# Patient Record
Sex: Female | Born: 1960 | Race: White | Hispanic: No | Marital: Married | Smoking: Never smoker
Health system: Southern US, Academic
[De-identification: ages and names within clinical notes are randomized; demographics above are authoritative.]

## PROBLEM LIST (undated history)

## (undated) DIAGNOSIS — K219 Gastro-esophageal reflux disease without esophagitis: Secondary | ICD-10-CM

## (undated) DIAGNOSIS — G4730 Sleep apnea, unspecified: Secondary | ICD-10-CM

## (undated) DIAGNOSIS — E039 Hypothyroidism, unspecified: Secondary | ICD-10-CM

## (undated) DIAGNOSIS — E109 Type 1 diabetes mellitus without complications: Secondary | ICD-10-CM

## (undated) DIAGNOSIS — E782 Mixed hyperlipidemia: Secondary | ICD-10-CM

## (undated) DIAGNOSIS — F419 Anxiety disorder, unspecified: Secondary | ICD-10-CM

## (undated) DIAGNOSIS — I10 Essential (primary) hypertension: Secondary | ICD-10-CM

## (undated) HISTORY — DX: Essential (primary) hypertension: I10

## (undated) HISTORY — DX: Hypothyroidism, unspecified: E03.9

## (undated) HISTORY — DX: Gastro-esophageal reflux disease without esophagitis: K21.9

## (undated) HISTORY — PX: HX GALL BLADDER SURGERY/CHOLE: SHX55

## (undated) HISTORY — PX: HX HYSTERECTOMY: SHX81

## (undated) HISTORY — DX: Mixed hyperlipidemia: E78.2

## (undated) HISTORY — DX: Sleep apnea, unspecified: G47.30

## (undated) HISTORY — PX: BLADDER SURGERY: SHX569

## (undated) HISTORY — DX: Type 1 diabetes mellitus without complications (CMS HCC): E10.9

## (undated) HISTORY — DX: Anxiety disorder, unspecified: F41.9

---

## 2009-11-15 ENCOUNTER — Observation Stay (HOSPITAL_COMMUNITY): Payer: Self-pay | Admitting: Obstetrics & Gynecology

## 2022-04-07 ENCOUNTER — Encounter (INDEPENDENT_AMBULATORY_CARE_PROVIDER_SITE_OTHER): Payer: Self-pay | Admitting: Surgery

## 2022-04-13 ENCOUNTER — Other Ambulatory Visit: Payer: Self-pay

## 2022-04-13 ENCOUNTER — Ambulatory Visit (INDEPENDENT_AMBULATORY_CARE_PROVIDER_SITE_OTHER): Payer: Medicaid Other | Admitting: Surgery

## 2022-04-13 ENCOUNTER — Encounter (INDEPENDENT_AMBULATORY_CARE_PROVIDER_SITE_OTHER): Payer: Self-pay | Admitting: Surgery

## 2022-04-13 VITALS — BP 139/75 | HR 84 | Temp 97.4°F | Ht 63.0 in | Wt 206.4 lb

## 2022-04-13 DIAGNOSIS — K219 Gastro-esophageal reflux disease without esophagitis: Secondary | ICD-10-CM

## 2022-04-13 DIAGNOSIS — Z6836 Body mass index (BMI) 36.0-36.9, adult: Secondary | ICD-10-CM

## 2022-04-13 DIAGNOSIS — R935 Abnormal findings on diagnostic imaging of other abdominal regions, including retroperitoneum: Secondary | ICD-10-CM

## 2022-04-13 DIAGNOSIS — R1013 Epigastric pain: Secondary | ICD-10-CM

## 2022-04-13 MED ORDER — PEG 3350-ELECTROLYTES 236 GRAM-22.74 GRAM-6.74 GRAM-5.86 GRAM SOLUTION
4.0000 L | Freq: Once | ORAL | 0 refills | Status: DC
Start: 2022-04-13 — End: 2022-05-09

## 2022-04-15 ENCOUNTER — Encounter (INDEPENDENT_AMBULATORY_CARE_PROVIDER_SITE_OTHER): Payer: Self-pay | Admitting: Surgery

## 2022-04-15 NOTE — H&P (Signed)
Office History and Physical      Reason for Visit: General (Abnormal MRI; mural thickening of sigmoid colon )    History of Present Illness  Rebecca Cain as a referral by Laurence Aly, CNP for evaluation of endoscopy with GERD and abnormal MRI which revealed posterior wall thickening , recommended endoscopy.  Denies constipation, diarrhea, change in bowel habits, rectal bleeding, weight loss, blood in stools, change in stool caliber, mucous in stools, or unintentional weight loss.  No abdominal pain.  No family history of colon cancer.  No melena, hematemesis, or dysphagia. Last colonoscopy ws at age of 89. Last A1c was 6.3    I have reviewed the patient's provided medical records and diagnostic testing including laboratory values, imaging results, documented encounters and providers notes with all pertinent information noted with respect to today's evaluation serving as unique tests and sources as a component of the medical decision making process for this encounter relevant to the patients independent evaluation by me today.        Patient Data  Patient History  Past Medical History:   Diagnosis Date   . Anxiety    . Diabetes mellitus type 1 (CMS HCC)    . Esophageal reflux    . Hypertension    . Hypothyroidism    . Mixed hyperlipidemia    . Sleep apnea          Past Surgical History:   Procedure Laterality Date   . BLADDER SURGERY     . HX CHOLECYSTECTOMY     . HX HYSTERECTOMY           Current Outpatient Medications   Medication Sig   . amLODIPine (NORVASC) 5 mg Oral Tablet amlodipine 5 mg tablet   . cyclobenzaprine (FLEXERIL) 10 mg Oral Tablet cyclobenzaprine 10 mg tablet   . escitalopram oxalate (LEXAPRO) 10 mg Oral Tablet escitalopram 10 mg tablet   . levothyroxine (SYNTHROID) 75 mcg Oral Tablet levothyroxine 75 mcg tablet   . metoclopramide HCl (REGLAN) 5 mg Oral Tablet metoclopramide 5 mg tablet   . oxybutynin (DITROPAN) 5 mg Oral Tablet oxybutynin chloride 5 mg tablet   . pantoprazole  (PROTONIX) 20 mg Oral Tablet, Delayed Release (E.C.) pantoprazole 20 mg tablet,delayed release   . rosuvastatin (CRESTOR) 5 mg Oral Tablet rosuvastatin 5 mg tablet   . spironolactone (ALDACTONE) 25 mg Oral Tablet spironolactone 25 mg tablet     No Known Allergies  Family Medical History:    None         Social History     Tobacco Use   . Smoking status: Never   . Smokeless tobacco: Never   Vaping Use   . Vaping Use: Never used   Substance Use Topics   . Alcohol use: Not Currently   . Drug use: Not Currently        The above documented section regarding past medical, past surgical, family, and social history (PMFSH) has been reviewed and considered and to the best of my knowledge represents a valid and accurate reflection of the patient's previous pertinent experiences documented by multiple providers and participants of the EMR.I cannot attest to all entries but do no recognize any gross inaccuracies as the data is a common field across all providers  Further history pertinent to the current encounter will be found as referenced       Physical Examination:    Vitals:    04/13/22 1349   BP: 139/75   Pulse: 84  Temp: 36.3 C (97.4 F)   SpO2: 94%   Weight: 93.6 kg (206 lb 6.4 oz)   Height: 1.6 m (5\' 3" )   BMI: 36.64      General: appropriate for age. in no acute distress.    HEENT: Atraumatic, Normocephalic.    Lungs: Nonlabored breathing with symmetric expansion    Heart:Regular wth respect to rate and rythmn.    Abdomen:Soft. Nontender. Nondistended     Psychiatric: Alert and oriented to person, place, and time. affect appropriate    Skin: No rashes or obvious skin lesions         Diagnosis:    ICD-10-CM    1. Gastroesophageal reflux disease, unspecified whether esophagitis present  K21.9       2. Abnormal MRI, pelvis  R93.5       3. Epigastric pain  R10.13           Plan:    Discussed indications, risks and benefits of colonoscopy and upper endoscopy with the patient.  Discussed the possibility of polypectomy,  biopsies, and possible repeat examinations.  Risks include bleeding, sedation risks, possibility of missed diagnosis of polyp or malignancy, and remote possibilities of perforation and death.  All questions were answered and informed consent was clearly obtained      This note may have been partially generated using MModal Fluency Direct system, and there may be some incorrect words, spellings, and punctuation that were not noted in checking the note before saving, though effort was made to avoid such errors.    MD FACS RVT  [facility] -General Surgery

## 2022-04-15 NOTE — H&P (View-Only) (Signed)
Office History and Physical      Reason for Visit: General (Abnormal MRI; mural thickening of sigmoid colon )    History of Present Illness  Rebecca Cain presents as a referral by Laurence Aly, CNP for evaluation of endoscopy with GERD and abnormal MRI which revealed posterior wall thickening , recommended endoscopy.  Denies constipation, diarrhea, change in bowel habits, rectal bleeding, weight loss, blood in stools, change in stool caliber, mucous in stools, or unintentional weight loss.  No abdominal pain.  No family history of colon cancer.  No melena, hematemesis, or dysphagia. Last colonoscopy ws at age of 89. Last A1c was 6.3    I have reviewed the patient's provided medical records and diagnostic testing including laboratory values, imaging results, documented encounters and providers notes with all pertinent information noted with respect to today's evaluation serving as unique tests and sources as a component of the medical decision making process for this encounter relevant to the patients independent evaluation by me today.        Patient Data  Patient History  Past Medical History:   Diagnosis Date   . Anxiety    . Diabetes mellitus type 1 (CMS HCC)    . Esophageal reflux    . Hypertension    . Hypothyroidism    . Mixed hyperlipidemia    . Sleep apnea          Past Surgical History:   Procedure Laterality Date   . BLADDER SURGERY     . HX CHOLECYSTECTOMY     . HX HYSTERECTOMY           Current Outpatient Medications   Medication Sig   . amLODIPine (NORVASC) 5 mg Oral Tablet amlodipine 5 mg tablet   . cyclobenzaprine (FLEXERIL) 10 mg Oral Tablet cyclobenzaprine 10 mg tablet   . escitalopram oxalate (LEXAPRO) 10 mg Oral Tablet escitalopram 10 mg tablet   . levothyroxine (SYNTHROID) 75 mcg Oral Tablet levothyroxine 75 mcg tablet   . metoclopramide HCl (REGLAN) 5 mg Oral Tablet metoclopramide 5 mg tablet   . oxybutynin (DITROPAN) 5 mg Oral Tablet oxybutynin chloride 5 mg tablet   . pantoprazole  (PROTONIX) 20 mg Oral Tablet, Delayed Release (E.C.) pantoprazole 20 mg tablet,delayed release   . rosuvastatin (CRESTOR) 5 mg Oral Tablet rosuvastatin 5 mg tablet   . spironolactone (ALDACTONE) 25 mg Oral Tablet spironolactone 25 mg tablet     No Known Allergies  Family Medical History:    None         Social History     Tobacco Use   . Smoking status: Never   . Smokeless tobacco: Never   Vaping Use   . Vaping Use: Never used   Substance Use Topics   . Alcohol use: Not Currently   . Drug use: Not Currently        The above documented section regarding past medical, past surgical, family, and social history (PMFSH) has been reviewed and considered and to the best of my knowledge represents a valid and accurate reflection of the patient's previous pertinent experiences documented by multiple providers and participants of the EMR.I cannot attest to all entries but do no recognize any gross inaccuracies as the data is a common field across all providers  Further history pertinent to the current encounter will be found as referenced       Physical Examination:    Vitals:    04/13/22 1349   BP: 139/75   Pulse: 84  Temp: 36.3 C (97.4 F)   SpO2: 94%   Weight: 93.6 kg (206 lb 6.4 oz)   Height: 1.6 m (5\' 3" )   BMI: 36.64      General: appropriate for age. in no acute distress.    HEENT: Atraumatic, Normocephalic.    Lungs: Nonlabored breathing with symmetric expansion    Heart:Regular wth respect to rate and rythmn.    Abdomen:Soft. Nontender. Nondistended     Psychiatric: Alert and oriented to person, place, and time. affect appropriate    Skin: No rashes or obvious skin lesions         Diagnosis:    ICD-10-CM    1. Gastroesophageal reflux disease, unspecified whether esophagitis present  K21.9       2. Abnormal MRI, pelvis  R93.5       3. Epigastric pain  R10.13           Plan:    Discussed indications, risks and benefits of colonoscopy and upper endoscopy with the patient.  Discussed the possibility of polypectomy,  biopsies, and possible repeat examinations.  Risks include bleeding, sedation risks, possibility of missed diagnosis of polyp or malignancy, and remote possibilities of perforation and death.  All questions were answered and informed consent was clearly obtained      This note may have been partially generated using MModal Fluency Direct system, and there may be some incorrect words, spellings, and punctuation that were not noted in checking the note before saving, though effort was made to avoid such errors.    MD FACS RVT  [facility] -General Surgery

## 2022-04-21 ENCOUNTER — Encounter (INDEPENDENT_AMBULATORY_CARE_PROVIDER_SITE_OTHER): Payer: Self-pay | Admitting: Surgery

## 2022-05-09 ENCOUNTER — Encounter (HOSPITAL_COMMUNITY)
Admission: RE | Disposition: A | Discharge status: Home / Self Care | Payer: Self-pay | Source: Ambulatory Visit | Attending: Surgery

## 2022-05-09 ENCOUNTER — Ambulatory Visit
Admission: RE | Admit: 2022-05-09 | Discharge: 2022-05-09 | Disposition: A | Discharge status: Home / Self Care | Payer: Medicaid Other | Source: Ambulatory Visit | Attending: Surgery | Admitting: Surgery

## 2022-05-09 ENCOUNTER — Encounter (HOSPITAL_COMMUNITY): Payer: Self-pay | Admitting: Surgery

## 2022-05-09 ENCOUNTER — Ambulatory Visit (HOSPITAL_COMMUNITY): Payer: Medicaid Other | Admitting: Certified Registered"

## 2022-05-09 ENCOUNTER — Ambulatory Visit (HOSPITAL_COMMUNITY): Payer: Medicaid Other | Admitting: Surgery

## 2022-05-09 ENCOUNTER — Other Ambulatory Visit: Payer: Self-pay

## 2022-05-09 DIAGNOSIS — K31A0 Gastric intestinal metaplasia, unspecified: Secondary | ICD-10-CM | POA: Insufficient documentation

## 2022-05-09 DIAGNOSIS — R1013 Epigastric pain: Secondary | ICD-10-CM | POA: Insufficient documentation

## 2022-05-09 DIAGNOSIS — K5730 Diverticulosis of large intestine without perforation or abscess without bleeding: Secondary | ICD-10-CM | POA: Insufficient documentation

## 2022-05-09 DIAGNOSIS — R935 Abnormal findings on diagnostic imaging of other abdominal regions, including retroperitoneum: Secondary | ICD-10-CM | POA: Insufficient documentation

## 2022-05-09 DIAGNOSIS — K2970 Gastritis, unspecified, without bleeding: Secondary | ICD-10-CM

## 2022-05-09 DIAGNOSIS — K2950 Unspecified chronic gastritis without bleeding: Secondary | ICD-10-CM | POA: Insufficient documentation

## 2022-05-09 DIAGNOSIS — K648 Other hemorrhoids: Secondary | ICD-10-CM | POA: Insufficient documentation

## 2022-05-09 DIAGNOSIS — K219 Gastro-esophageal reflux disease without esophagitis: Secondary | ICD-10-CM | POA: Insufficient documentation

## 2022-05-09 DIAGNOSIS — K319 Disease of stomach and duodenum, unspecified: Secondary | ICD-10-CM | POA: Insufficient documentation

## 2022-05-09 SURGERY — GASTROSCOPY WITH BIOPSY
Anesthesia: General | Wound class: Clean Contaminated Wounds-The respiratory, GI, Genital, or urinary

## 2022-05-09 MED ORDER — LIDOCAINE (PF) 100 MG/5 ML (2 %) INTRAVENOUS SYRINGE
INJECTION | Freq: Once | INTRAVENOUS | Status: DC | PRN
Start: 2022-05-09 — End: 2022-05-09
  Administered 2022-05-09: 100 mg via INTRAVENOUS

## 2022-05-09 MED ORDER — PROPOFOL 10 MG/ML INTRAVENOUS EMULSION
Freq: Once | INTRAVENOUS | Status: DC | PRN
Start: 2022-05-09 — End: 2022-05-09
  Administered 2022-05-09: 25 mL via INTRAVENOUS

## 2022-05-09 MED ORDER — LACTATED RINGERS INTRAVENOUS SOLUTION
INTRAVENOUS | Status: DC
Start: 2022-05-09 — End: 2022-05-09
  Administered 2022-05-09: 0 via INTRAVENOUS

## 2022-05-09 SURGICAL SUPPLY — 4 items
FORCEPS BIOPSY NEEDLE 240CM 2.2MM RJ 4 2.8MM STD CPC STRL DISP ORNG (ENDOSCOPIC SUPPLIES) ×1 IMPLANT
FORCEPS BIOPSY NEEDLE 240CM 2._2MM RJ 4 2.8MM STD CPC STRL (INSTRUMENTS ENDOMECHANICAL) ×1
VALVE AIR/H20 DEFENDO BUTTON KIT SUCT BIOPSY STRL DISP (ENDOSCOPIC SUPPLIES) ×1 IMPLANT
VALVE AIR/H20 DEFENDO BUTTON KIT SUCT BIOPSY STRL DISP (INSTRUMENTS ENDOMECHANICAL) ×1

## 2022-05-09 NOTE — Interval H&P Note (Signed)
[facility]      H&P UPDATE FORM                                                                                  Rebecca Cain, Rebecca Cain, 61 y.o. female  Date of Admission:  05/09/2022  Date of Birth:  [birth year]    05/09/2022    STOP: IF H&P IS GREATER THAN 30 DAYS FROM SURGICAL DAY COMPLETE NEW H&P IS REQUIRED.     H & P updated the day of the procedure.  1.  H&P completed within 30 days of surgical procedure and has been reviewed within 24 hours of admission but prior to surgery or a procedure requiring anesthesia services, the patient has been examined, and no change has occured in the patients condition since the H&P was completed.       Change in medications: No        No LMP recorded. Patient has had a hysterectomy.      Comments:     2.  Patient continues to be appropriate candidate for planned surgical procedure. YES    Reinaldo Meeker, MD

## 2022-05-09 NOTE — Anesthesia Postprocedure Evaluation (Signed)
Anesthesia Post Op Evaluation    Patient: Rebecca Cain  Procedure(s):  EGD WITH BIOPSY  COLONOSCOPY    Last Vitals:Temperature: 36.4 C (97.5 F) (05/09/22 1046)  Heart Rate: 75 (05/09/22 1046)  BP (Non-Invasive): 112/74 (05/09/22 1046)  Respiratory Rate: 16 (05/09/22 1046)  SpO2: 95 % (05/09/22 1046)    No notable events documented.    Patient is sufficiently recovered from the effects of anesthesia to participate in the evaluation and has returned to their pre-procedure level.  Patient location during evaluation: PACU       Patient participation: complete - patient participated  Level of consciousness: awake and alert and responsive to verbal stimuli    Pain score: 0  Pain management: adequate  Airway patency: patent    Anesthetic complications: no  Cardiovascular status: acceptable  Respiratory status: acceptable  Hydration status: acceptable  Patient post-procedure temperature: Pt Normothermic   PONV Status: Absent

## 2022-05-09 NOTE — OR Surgeon (Signed)
[facility]    OPERATIVE NOTE    Patient Name: Rebecca Cain, Rebecca Cain MRN:: [id number]  Date of Birth: 02/08/1961  Date of Service: 05/09/2022     Pre-Operative Diagnosis: GERD;ABDOMINAL PAIN;ABNORMAL MRI     Post-Operative Diagnosis: Gastritis, Retained bile  Diverticulosis, Hemorrhoids    Procedure(s)/Description:  EGD WITH BIOPSY: 43239 (CPT)  COLONOSCOPY: 17408 (CPT)     Attending Surgeon: Clide Dales, MD     Anesthesia Staff:  CRNA: Welton Flakes, CRNA    Anesthesia Type: .General     Estimated Blood Loss:  minimal    Specimens Removed:   ID Type Source Tests Collected by Time Destination   1 : Biopsy x 2 Tissue Antrum SURGICAL PATHOLOGY SPECIMEN Clide Dales, MD 05/09/2022 1030       Order Name Source Comment Collection Info Order Time   SURGICAL PATHOLOGY SPECIMEN Antrum Pre-op diagnosis:  GERD;ABDOMINAL PAIN;ABNORMAL MRI    Post-op diagnosis:   Gastritis, Retained bile Collected By: Clide Dales, MD 05/09/2022 10:37 AM     Release to patient   Automated             Complications:  None    Condition:  Stable    Disposition:   PACU - hemodynamically stable.        Intraoperative Findings:     The Olympus gastroscope was brought to the operating field gently inserted into the patient's oropharynx and advanced to the level of the second and third portions of the duodenum under direct visualization without difficulty.  Once this anatomic landmark was reached the scope was slowly retracted with circumferential visualization of all upper intestinal walls with findings of gastritis and retained bile.  There was no evidence of gastric or duodenal ulcers, polyps, or AV malformations.  Retroflexion of the scope revealed no significant hiatal hernia.  The distal esophagus revealed no significant esophagitis with an intact Z line and no evidence of Barrett's changes.  The remainder of the esophagus was within normal limits without evidence of esophageal stricture or mass.  No  esophageal varices.  There was some retained bile within the stomach itself.        Once the upper endoscopy was performed the Olympus colonoscope was brought to the operating field gently inserted into the patient's rectum and advanced to the level of the cecum under direct visualization without difficulty.  Once this anatomic landmark was reached the scope was slowly retracted with circumferential visualization of all colonic and rectal walls with findings of internal hemorrhoids and sigmoid diverticulosis.  There was no evidence of colonic or rectal polyps, tumors or AV malformations.   The colonic and rectal mucosa revealed no significant abnormalities where visualized.  Retroflexion of the scope revealed the presence of internal hemorrhoids. The prep was overall adequate for diagnosstic exam however very small abnormalities may be missed given the nature of the exam.          Description of Procedure           The patient was brought to the Operating Suite and placed in the supine position on the operating table.  Anesthesia/nursing personnel provided IV access as well as hemodynamic monitoring.  After appropriate lines and leads were placed, the patient was placed in the left lateral decubitus position where they received total IV anesthesia.   After the patient was deemed comfortable, the Olympus gastroscope was brought into the operative field, inserted into the patient's oropharynx, and advanced to the level  of the esophagus where the esophagus was intubated without difficulty.  The scope was then passed down the esophagus into the stomach.  The stomach was insufflated with air followed by identification of the antrum of the stomach.  The pylorus of the stomach was identified followed by passage of the scope through the pylorus to the second and third portions of the duodenum without difficulty.  Once this anatomic location had been reached, the scope was slowly retracted with circumferential visualization  of all upper intestinal walls with findings as dictated above.  The scope was brought back to the antrum of the stomach where antral biopsy for Helicobacter pylori was accomplished followed by retroflexion of the scope to evaluate the fundus, body, and cardia of the stomach with findings as noted.  The scope was straightened and the stomach decompressed of as much air as possible.  The scope removed back to the GE junction which was inspected thoroughly.  The scope was then slowly retracted through the remainder of the esophagus.             After completion of the upper endoscopy, attention was directed to the perineum where the Olympus colonoscope was brought to the operative field, gently inserted into the patient's rectum, and advanced to the level of the cecum under direct visualization with identification of the cecum through the use of normal anatomic landmarks.   Once the cecum was clearly identified, the scope was slowly retracted with circumferential visualization of all colonic and rectal walls with findings dictated above. In the rectum  the scope was retroflexed to evaluate the anorectal junction for the presence of hemorrhoids.  The scope was slowly straightened, the colon decompressed of as much air as possible and removed without difficulty.  The patient was returned to the post anesthesia care unit in stable condition, having tolerated the procedure well.           Disposition:     Repeat colonoscopy in 10 years barring any change in symptoms    Clide Dales MD FACS RVT  [facility] -General Surgery

## 2022-05-09 NOTE — Anesthesia Preprocedure Evaluation (Signed)
ANESTHESIA PRE-OP EVALUATION  Planned Procedure: EGD WITH BIOPSY  COLONOSCOPY  Review of Systems     anesthesia history negative     patient summary reviewed  nursing notes reviewed        Pulmonary   sleep apnea,   Cardiovascular    Hypertension, ECG reviewed and hyperlipidemia ,No peripheral edema,  Exercise Tolerance: > or = 4 METS        GI/Hepatic/Renal    GERD        Endo/Other    Prediabetes, hypothyroidism and obesity,   no type 1 diabetes,     Neuro/Psych/MS    anxiety     Cancer    negative hematology/oncology ROS,                   Physical Assessment      Airway       Mallampati: III    TM distance: <3 FB    Neck ROM: full  Mouth Opening: fair.            Dental                    Pulmonary    Breath sounds clear to auscultation  (-) no rhonchi, no decreased breath sounds, no wheezes, no rales and no stridor     Cardiovascular    Rhythm: regular  Rate: Normal  (-) no friction rub, carotid bruit is not present, no peripheral edema and no murmur     Other findings  Intact dentition          Plan  ASA 2     Planned anesthesia type: general     total intravenous anesthesia                        Anesthetic plan and risks discussed with patient  Signed consent obtained            Patient's NPO status is appropriate for Anesthesia.

## 2022-05-09 NOTE — Discharge Instructions (Signed)
SURGICAL DISCHARGE INSTRUCTIONS     Dr. Abbe Amsterdam, Ladona Horns, MD  performed your EGD WITH BIOPSY, COLONOSCOPY today at the [facility]    [facility]:  Monday through Friday from 8 a.m. - 4 p.m.: [phone]    For T&D: [phone]  Between 4 p.m. - 8 a.m., weekends and holidays:  Call ER [phone]    PLEASE SEE WRITTEN HANDOUTS AS DISCUSSED BY YOUR NURSE    SIGNS AND SYMPTOMS OF A WOUND / INCISION INFECTION   Be sure to watch for the following:  Increase in redness or red streaks near or around the wound or incision.  Increase in pain that is intense or severe and cannot be relieved by the pain medication that your doctor has given you.  Increase in swelling that cannot be relieved by elevation of a body part, or by applying ice, if permitted.  Increase in drainage, or if yellow / green in color and smells bad. This could be on a dressing or a cast.  Increase in fever for longer than 24 hours, or an increase that is higher than 101 degrees Fahrenheit (normal body temperature is 98 degrees Fahrenheit). The incision may feel warm to the touch.    **CALL YOUR DOCTOR IF ONE OR MORE OF THESE SIGNS / SYMPTOMS SHOULD OCCUR.    ANESTHESIA INFORMATION   ANESTHESIA -- ADULT PATIENTS:  You have received intravenous sedation / general anesthesia, and you may feel drowsy and light-headed for several hours. You may even experience some forgetfulness of the procedure. DO NOT DRIVE A MOTOR VEHICLE or perform any activity requiring complete alertness or coordination until you feel fully awake in about 24-48 hours. Do not drink alcoholic beverages for at least 24 hours. Do not stay alone, you must have a responsible adult available to be with you. You may also experience a dry mouth or nausea for 24 hours. This is a normal side effect and will disappear as the effects of the medication wear off.    REMEMBER   If you experience any difficulty breathing, chest pain, bleeding that you feel is  excessive, persistent nausea or vomiting or for any other concerns:  Call your physician Dr.  Abbe Amsterdam, Ladona Horns, MD   at [phone] . You may also ask to have the general doctor on call paged. They are available to you 24 hours a day.      SPECIAL INSTRUCTIONS / COMMENTS   FINDINGS: Gastritis, Retained bile, Diverticulosis, Hemorrhoids    FOLLOW-UP APPOINTMENTS   Please call your surgeon's office at the number listed to schedule a date / time of return for follow-up.     Dr Whitney Muse [phone]

## 2022-05-10 DIAGNOSIS — K319 Disease of stomach and duodenum, unspecified: Secondary | ICD-10-CM

## 2022-05-10 DIAGNOSIS — Z6834 Body mass index (BMI) 34.0-34.9, adult: Secondary | ICD-10-CM

## 2022-05-10 DIAGNOSIS — K31A11 Gastric intestinal metaplasia without dysplasia, involving the antrum: Secondary | ICD-10-CM

## 2022-05-10 LAB — SURGICAL PATHOLOGY SPECIMEN: Clinical History: ABNORMAL

## 2022-05-11 ENCOUNTER — Other Ambulatory Visit: Payer: Self-pay

## 2023-05-08 IMAGING — MR MRI LUMBAR SPINE WITHOUT CONTRAST
5 of 6 series · 32 of 48 positions shown · IV contrast (gadolinium)
Comparison: None available.

﻿EXAM:  86000   MRI LUMBAR SPINE WITHOUT CONTRAST
INDICATION: Chronic low back pain with radicular symptoms to both legs.
TECHNIQUE: Multiplanar, multisequential MRI of the lumbosacral spine was performed without gadolinium contrast.

[Series 5: T2 · sagittal · 4.0mm · 0.94mm/px · 6 of 13 slices shown (1 of 3)]
[im 1/13]
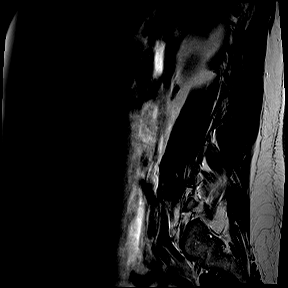
[im 3/13]
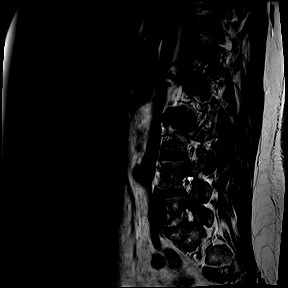
[im 5/13]
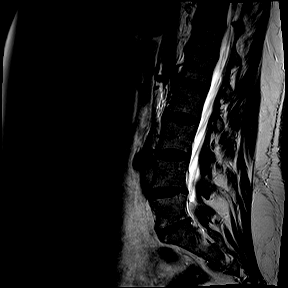
[im 8/13]
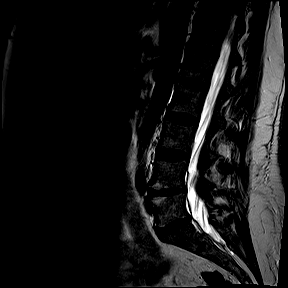
[im 10/13]
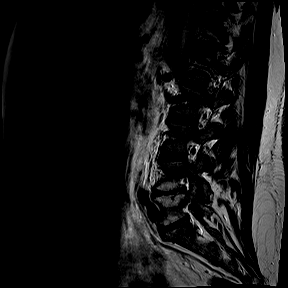
[im 13/13]
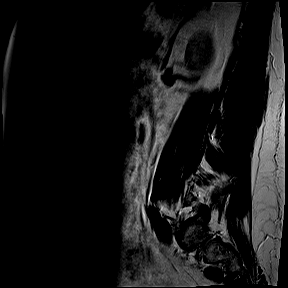

[Series 6: T1 · sagittal · 4.0mm · 0.94mm/px · 6 of 13 slices shown (1 of 2)]
[im 1/13]
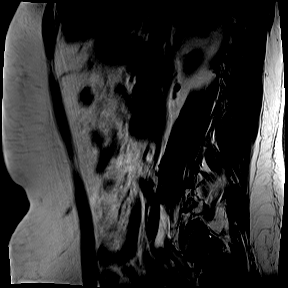
[im 3/13]
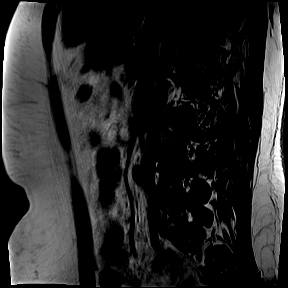
[im 5/13]
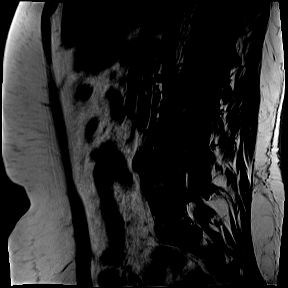
[im 8/13]
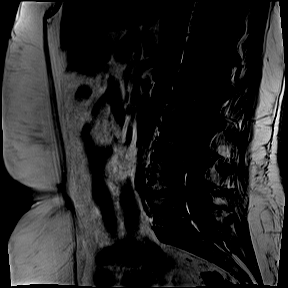
[im 10/13]
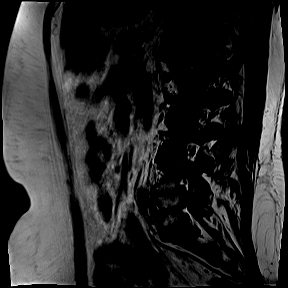
[im 13/13]
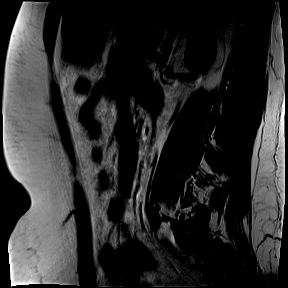

[Series 8: T2 · axial · 4.0mm · 0.52mm/px · z∈[-134,+76]mm · 11 of 23 slices shown (2 of 3)]
[im 1/23]
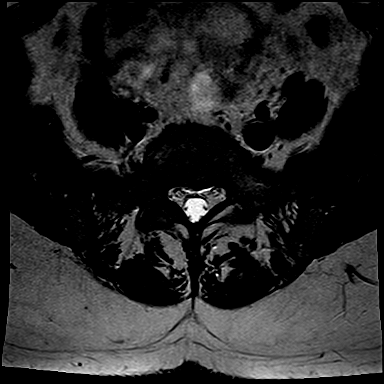
[im 3/23]
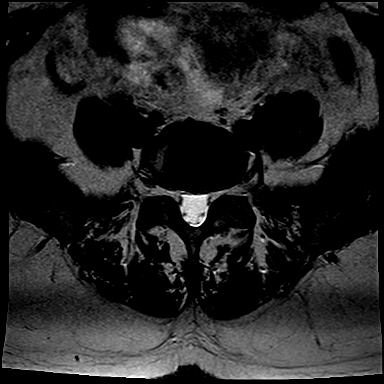
[im 5/23]
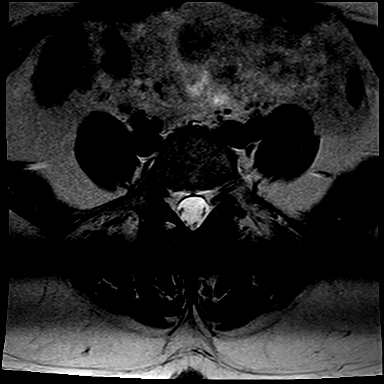
[im 7/23]
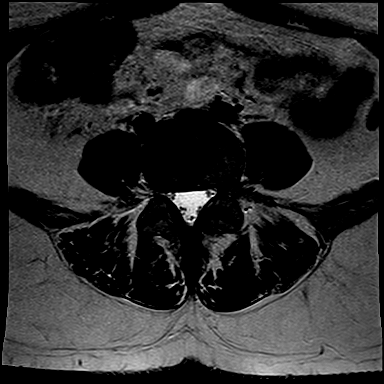
[im 9/23]
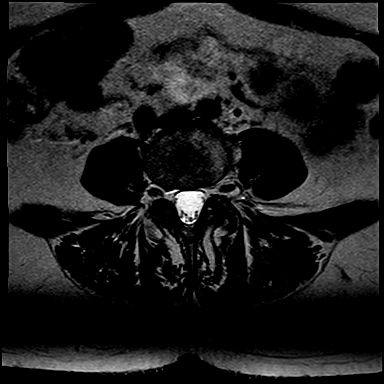
[im 12/23]
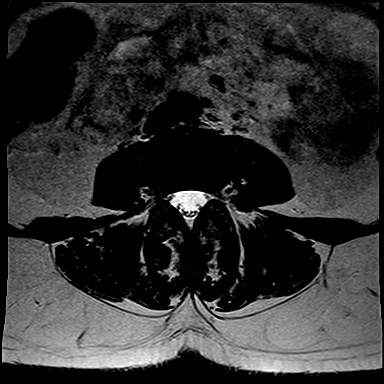
[im 14/23]
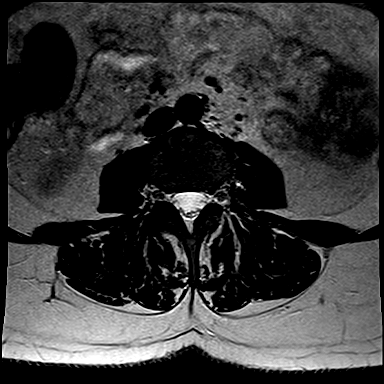
[im 16/23]
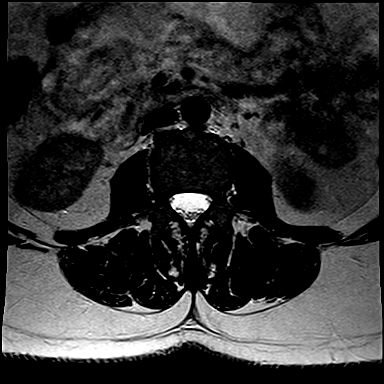
[im 18/23]
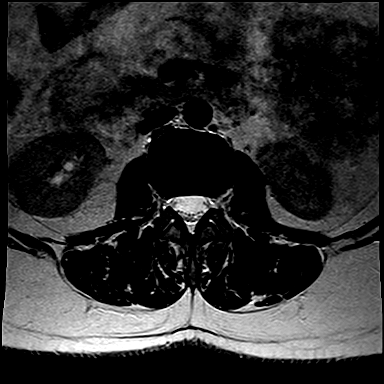
[im 20/23]
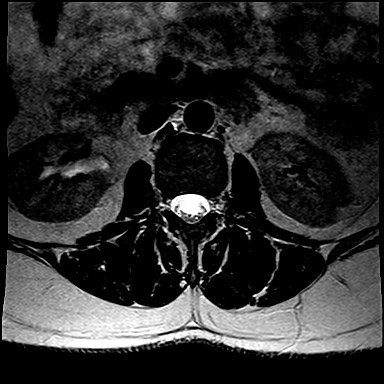
[im 23/23]
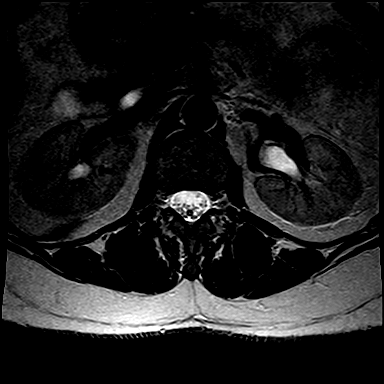

[Series 9: T1 · axial · 4.0mm · 0.52mm/px · 1 of 23 slices shown (2 of 2)]
[im 1/23]
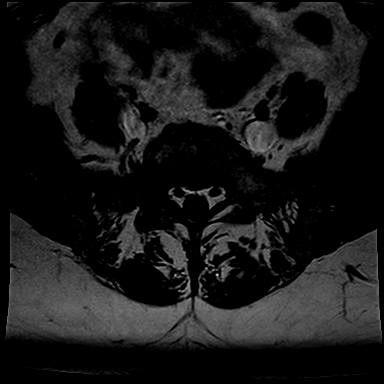

[Series 10: T2 · coronal · 5.2mm · 0.82mm/px · 8 of 18 slices shown (3 of 3)]
[im 1/18]
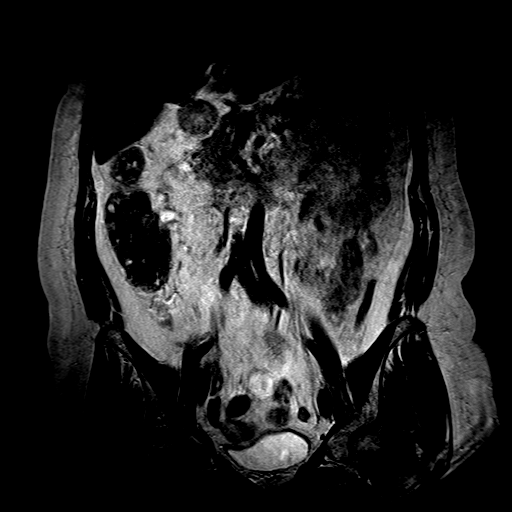
[im 3/18]
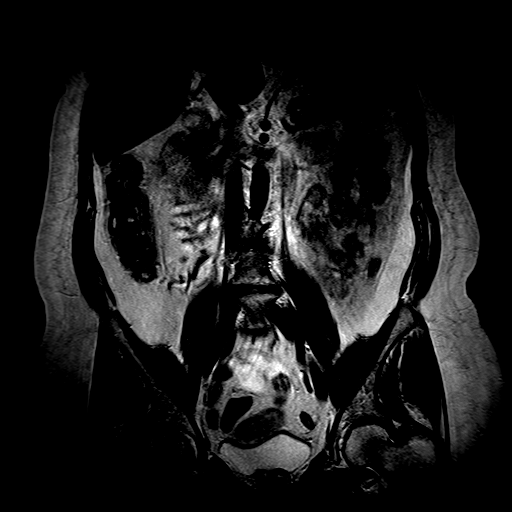
[im 5/18]
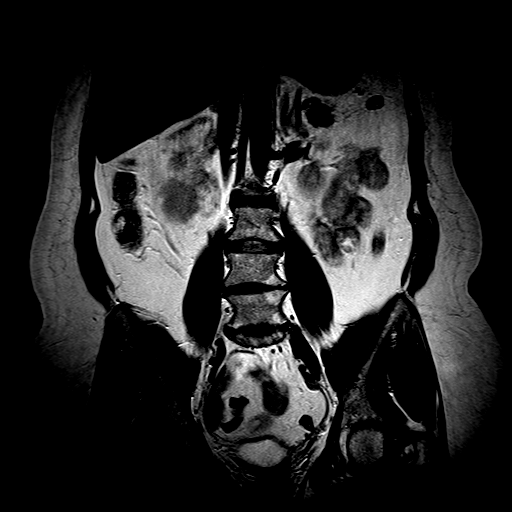
[im 8/18]
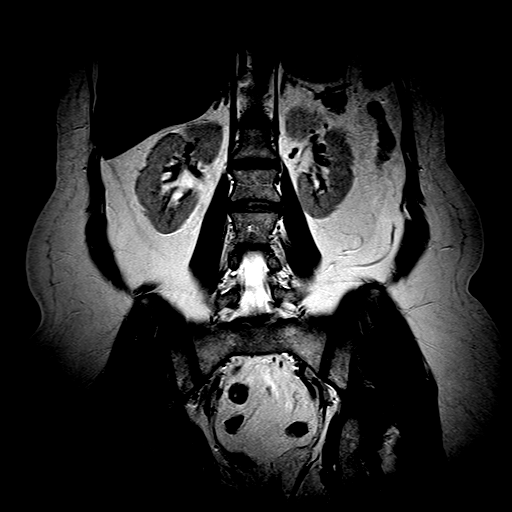
[im 10/18]
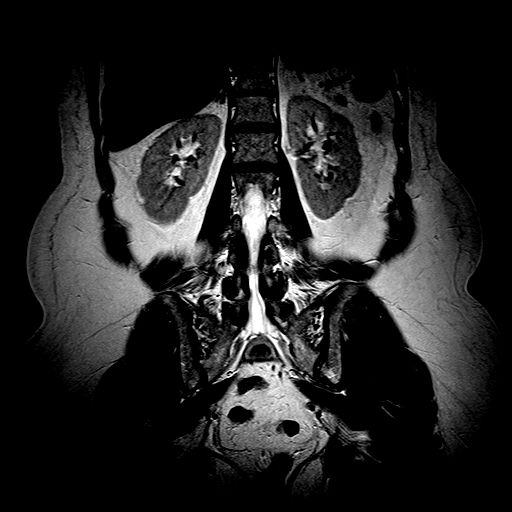
[im 13/18]
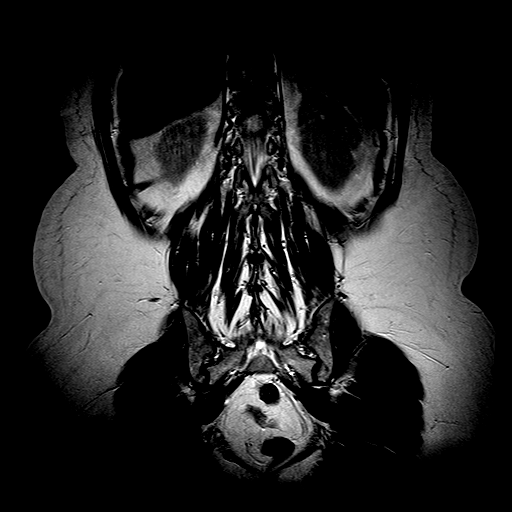
[im 15/18]
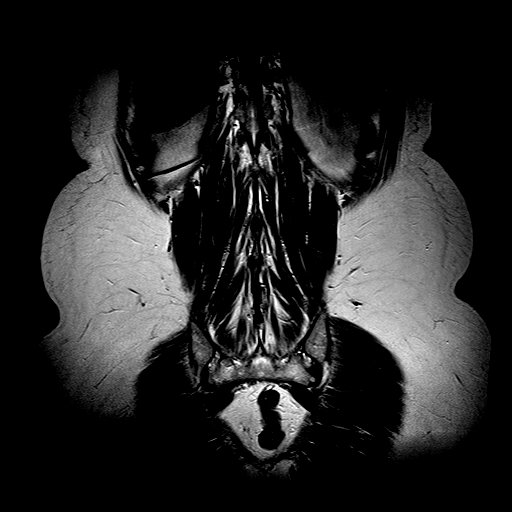
[im 18/18]
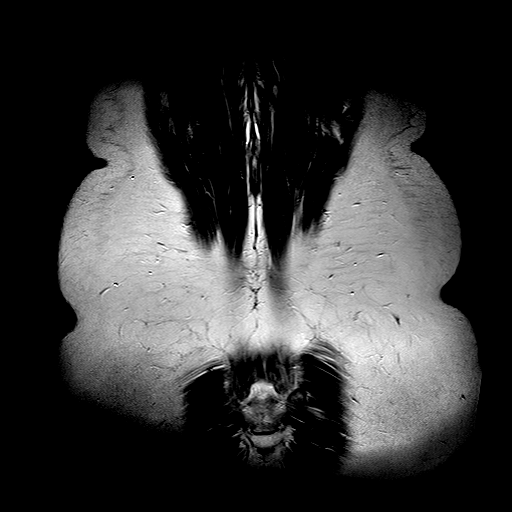

[32 of 48 positions shown; findings below may reference images not displayed]

FINDINGS: No focal bone changes of lumbar vertebrae are seen.

At L1-2 level, no focal disc lesions are seen. 

At L2-3 level, mild degenerative disc changes and facet arthropathy are noted causing minimal compromise of both lateral recesses.  Similar finding is also noted at L3-4 level. 

At L4-5 level, degenerative disc disease of moderately significant degree with bulging annulus and facet arthropathy are causing mild compromise of both neural foramina.

At L5-S1 level, degenerative disc disease and facet arthropathy are causing mild biforaminal narrowing.

Paravertebral soft tissues are unremarkable.
IMPRESSION: 1. No acute bony lesions of lumbar vertebrae. 

2. Moderate degenerative disc changes at L4-5 level.  Mild compromise of both neural foramina by degenerative process at L4-5 level with facet arthropathy.

3. Findings at other disc levels are described above in detail.
